# Patient Record
Sex: Male | Born: 1965 | Race: White | Hispanic: No | Marital: Single | Smoking: Never smoker
Health system: Southern US, Community
[De-identification: ages and names within clinical notes are randomized; demographics above are authoritative.]

## PROBLEM LIST (undated history)

## (undated) HISTORY — PX: FRACTURE SURGERY: SHX138

---

## 2006-11-01 ENCOUNTER — Emergency Department (HOSPITAL_COMMUNITY): Admission: EM | Admit: 2006-11-01 | Discharge: 2006-11-01 | Payer: Self-pay | Admitting: Emergency Medicine

## 2015-01-05 ENCOUNTER — Emergency Department
Admission: EM | Admit: 2015-01-05 | Discharge: 2015-01-05 | Disposition: A | Discharge status: Home / Self Care | Payer: 59 | Source: Home / Self Care | Attending: Family Medicine | Admitting: Family Medicine

## 2015-01-05 DIAGNOSIS — T63451A Toxic effect of venom of hornets, accidental (unintentional), initial encounter: Secondary | ICD-10-CM

## 2015-01-05 MED ORDER — DOXYCYCLINE HYCLATE 100 MG PO CAPS: 100.0000 mg | ORAL_CAPSULE | Freq: Two times a day (BID) | ORAL | Status: DC

## 2015-01-05 MED ORDER — TRIAMCINOLONE ACETONIDE 40 MG/ML IJ SUSP
40.0000 mg | Freq: Once | INTRAMUSCULAR | Status: AC
  Administered 2015-01-05: 40 mg via INTRAMUSCULAR

## 2015-01-05 NOTE — Discharge Instructions (Signed)
Begin taking an antihistamine (such as Zyrtec, Claritin, or Allegra) once daily.  Apply cold compress several times daily until swelling resolves. If symptoms become significantly worse during the night or over the weekend, proceed to the local emergency room.    Bee, Wasp, or Hornet Sting Your caregiver has diagnosed you as having an insect sting. An insect sting appears as a red lump in the skin that sometimes has a tiny hole in the center, or it may have a stinger in the center of the wound. The most common stings are from wasps, hornets and bees. Individuals have different reactions to insect stings.  A normal reaction may cause pain, swelling, and redness around the sting site.  A localized allergic reaction may cause swelling and redness that extends beyond the sting site.  A large local reaction may continue to develop over the next 12 to 36 hours.  On occasion, the reactions can be severe (anaphylactic reaction). An anaphylactic reaction may cause wheezing; difficulty breathing; chest pain; fainting; raised, itchy, red patches on the skin; a sick feeling to your stomach (nausea); vomiting; cramping; or diarrhea. If you have had an anaphylactic reaction to an insect sting in the past, you are more likely to have one again. HOME CARE INSTRUCTIONS   With bee stings, a small sac of poison is left in the wound. Brushing across this with something such as a credit card, or anything similar, will help remove this and decrease the amount of the reaction. This same procedure will not help a wasp sting as they do not leave behind a stinger and poison sac.  Apply a cold compress for 10 to 20 minutes every hour for 1 to 2 days, depending on severity, to reduce swelling and itching.  To lessen pain, a paste made of water and baking soda may be rubbed on the bite or sting and left on for 5 minutes.  To relieve itching and swelling, you may use take medication or apply medicated creams or lotions as  directed.  Only take over-the-counter or prescription medicines for pain, discomfort, or fever as directed by your caregiver.  Wash the sting site daily with soap and water. Apply antibiotic ointment on the sting site as directed.  If you suffered a severe reaction:  If you did not require hospitalization, an adult will need to stay with you for 24 hours in case the symptoms return.  You may need to wear a medical bracelet or necklace stating the allergy.  You and your family need to learn when and how to use an anaphylaxis kit or epinephrine injection.  If you have had a severe reaction before, always carry your anaphylaxis kit with you. SEEK MEDICAL CARE IF:   None of the above helps within 2 to 3 days.  The area becomes red, warm, tender, and swollen beyond the area of the bite or sting.  You have an oral temperature above 102 F (38.9 C). SEEK IMMEDIATE MEDICAL CARE IF:  You have symptoms of an allergic reaction which are:  Wheezing.  Difficulty breathing.  Chest pain.  Lightheadedness or fainting.  Itchy, raised, red patches on the skin.  Nausea, vomiting, cramping or diarrhea. ANY OF THESE SYMPTOMS MAY REPRESENT A SERIOUS PROBLEM THAT IS AN EMERGENCY. Do not wait to see if the symptoms will go away. Get medical help right away. Call your local emergency services (911 in [place] DO NOT drive yourself to the hospital. MAKE SURE YOU:   Understand these instructions.  instructions.  Will watch your condition.  Will get help right away if you are not doing well or get worse. Document Released: 05/09/2005 Document Revised: 08/01/2011 Document Reviewed: 10/24/2009 [facility] Patient Information 2015 [place], [place]. This information is not intended to replace advice given to you by your health care provider. Make sure you discuss any questions you have with your health care provider.

## 2015-01-05 NOTE — ED Provider Notes (Signed)
CSN: [id number]     Arrival date & time 01/05/15  1808 History   First MD Initiated Contact with Patient 01/05/15 1842     Chief Complaint  Patient presents with  . Insect Bite      HPI Comments: Patient was trimming a hedge yesterday evening at about 7:30 when numerous hornets stung him on his right hand/wrist, left elbow, right abdomen, and right jaw.  No difficulty swallowing, shortness of breath, or wheezing.  No hives.  Today his right hand/wrist and distal forearm were swollen, erythematous, and warm.  He feels well otherwise.  No fevers, chills, and sweats                                                                                                                                                   The history is provided by the patient.    History reviewed. No pertinent past medical history. Past Surgical History  Procedure Laterality Date  . Fracture surgery      rt pinky finger age 13   History reviewed. No pertinent family history. Social History  Substance Use Topics  . Smoking status: Never Smoker   . Smokeless tobacco: None  . Alcohol Use: Yes     Comment: 1-2 beer    Review of Systems  Constitutional: Negative for fever, chills, diaphoresis, activity change and fatigue.  HENT: Negative.  Negative for sore throat and trouble swallowing.   Eyes: Negative.   Respiratory: Negative for cough, chest tightness, shortness of breath, wheezing and stridor.   Cardiovascular: Negative.   Gastrointestinal: Negative.   Genitourinary: Negative.   Musculoskeletal: Negative.   Skin: Positive for color change.  Neurological: Negative for headaches.    Allergies  Review of patient's allergies indicates not on file.  Home Medications   Prior to Admission medications   Medication Sig Start Date End Date Taking? Authorizing Provider  doxycycline (VIBRAMYCIN) 100 MG capsule Take 1 capsule (100 mg total) by mouth 2 (two) times daily. 01/05/15   Lattie Haw, MD   BP 115/75  mmHg  Pulse 80  Temp(Src) 99.4 F (37.4 C) (Oral)  Ht 6\' 5"  (1.956 m)  Wt 230 lb (104.327 kg)  BMI 27.27 kg/m2  SpO2 96% Physical Exam  Constitutional: He is oriented to person, place, and time. He appears well-developed and well-nourished. No distress.  HENT:  Head: Normocephalic.  Nose: Nose normal.  Mouth/Throat: Oropharynx is clear and moist.  Eyes: Conjunctivae are normal. Pupils are equal, round, and reactive to light. Right eye exhibits no discharge. Left eye exhibits no discharge.  Neck: Neck supple.  Cardiovascular: Normal heart sounds.   Pulmonary/Chest: Breath sounds normal.  Abdominal: There is no tenderness.  Musculoskeletal: He exhibits no edema.  Lymphadenopathy:    He has no cervical adenopathy.  Neurological: He is alert and oriented to person,  place, and time.  Skin: Skin is warm and dry. He is not diaphoretic. There is erythema.     The dorsum of patient's right wrist and hand are edematous, warm, and mildly erythematous without tenderness to palpation.  There is also a patch of macular erythema on his right lower abdomen and erythema on his chin.  Nursing note and vitals reviewed.   ED Course  Procedures  none   MDM   1. Hornet sting, accidental or unintentional, initial encounter; ?early cellulitis    Kenalog  IM Begin doxycycline  BID for staph coverage Begin taking an antihistamine (such as Zyrtec, Claritin, or Allegra) once daily.  Apply cold compress several times daily until swelling resolves. If symptoms become significantly worse during the night or over the weekend, proceed to the local emergency room.     Lattie Haw, MD 01/06/15 [phone]

## 2015-01-05 NOTE — ED Notes (Signed)
About 7 pm 8/14 pt was working in the yard and stirred up bees nest.  Has sting on left elbow, multiple stings on right hand, multiple stings on right jaw and several on side Jaw and right hand swollen

## 2020-01-02 ENCOUNTER — Other Ambulatory Visit: Payer: Self-pay

## 2020-01-02 ENCOUNTER — Emergency Department (INDEPENDENT_AMBULATORY_CARE_PROVIDER_SITE_OTHER)
Admission: EM | Admit: 2020-01-02 | Discharge: 2020-01-02 | Disposition: A | Discharge status: Home / Self Care | Payer: 59 | Source: Home / Self Care

## 2020-01-02 ENCOUNTER — Encounter: Payer: Self-pay | Admitting: Emergency Medicine

## 2020-01-02 ENCOUNTER — Emergency Department (INDEPENDENT_AMBULATORY_CARE_PROVIDER_SITE_OTHER): Payer: 59

## 2020-01-02 DIAGNOSIS — J029 Acute pharyngitis, unspecified: Secondary | ICD-10-CM

## 2020-01-02 DIAGNOSIS — R05 Cough: Secondary | ICD-10-CM

## 2020-01-02 DIAGNOSIS — R059 Cough, unspecified: Secondary | ICD-10-CM

## 2020-01-02 DIAGNOSIS — R062 Wheezing: Secondary | ICD-10-CM

## 2020-01-02 DIAGNOSIS — J9801 Acute bronchospasm: Secondary | ICD-10-CM

## 2020-01-02 MED ORDER — ALBUTEROL SULFATE HFA 108 (90 BASE) MCG/ACT IN AERS: 1.0000 | INHALATION_SPRAY | Freq: Four times a day (QID) | RESPIRATORY_TRACT | 0 refills | Status: AC | PRN

## 2020-01-02 MED ORDER — PREDNISONE 20 MG PO TABS: ORAL_TABLET | ORAL | 0 refills | Status: AC

## 2020-01-02 NOTE — Discharge Instructions (Signed)
°  You may try the prednisone and albuterol to help with cough and wheeze.  Call to schedule a follow up visit with a primary care provider for ongoing healthcare needs and recheck of symptoms next week if not improving.  Due to concern for possibly having Covid-19, it is advised that you self-isolate at home until test results come back, usually 2-3 days.  If positive, it is recommended you stay isolated for at least 10 days after symptom onset and 24 hours after last fever without taking medication (whichever is longer).  If you MUST go out, please wear a mask at all times, limit contact with others.   If your test is negative, you still have plenty of time to get the Covid vaccine. It is recommended you schedule an appointment to get your vaccine once you get over this current illness.  Please ask your primary care provider about any questions/concerns related to the vaccine.

## 2020-01-02 NOTE — ED Triage Notes (Signed)
Cough, runny nose, sore throat x 4 days, runny nose ans cough have resolved still has scratchy throat. Unvaccinated

## 2020-01-02 NOTE — ED Provider Notes (Signed)
[facility]    CSN: [id number] Arrival date & time: 01/02/20  1807      History   Chief Complaint Chief Complaint  Patient presents with  . Cough    HPI Todd Dorsey is a 54 y.o. male.   HPI  Todd Dorsey is a 54 y.o. male presenting to UC with c/o 4 days of cough, runny nose, and sore throat.  Runny nose has nearly resolved but he still has a scratchy throat and dry cough.  Denies known exposure to strep or Covid but he is unvaccinated, and a nurse at his work recommended he be tested for [employer]. No known fever. Denies n/v/d. No recent travel. No hx of asthma. Pt does not smoke cigarettes but has smoked marijuana in the past.    History reviewed. No pertinent past medical history.  There are no problems to display for this patient.   Past Surgical History:  Procedure Laterality Date  . FRACTURE SURGERY     rt pinky finger age 52       Home Medications    Prior to Admission medications   Medication Sig Start Date End Date Taking? Authorizing Provider  albuterol (VENTOLIN HFA) 108 (90 Base) MCG/ACT inhaler Inhale 1-2 puffs into the lungs every 6 (six) hours as needed for wheezing or shortness of breath. 01/02/20   Lurene Shadow, PA-C  predniSONE (DELTASONE) 20 MG tablet 3 tabs po day one, then 2 po daily x 4 days 01/02/20   Lurene Shadow, PA-C    Family History Family History  Problem Relation Age of Onset  . Thyroid disease Mother     Social History Social History   Tobacco Use  . Smoking status: Never Smoker  . Smokeless tobacco: Never Used  Vaping Use  . Vaping Use: Never used  Substance Use Topics  . Alcohol use: Yes    Comment: 1-2 beer  . Drug use: Not on file     Allergies   Patient has no known allergies.   Review of Systems Review of Systems  Constitutional: Negative for chills and fever.  HENT: Positive for congestion (minimal) and sore throat. Negative for ear pain, trouble swallowing and voice change.   Respiratory:  Positive for cough and wheezing (occasional). Negative for chest tightness and shortness of breath.   Cardiovascular: Negative for chest pain and palpitations.  Gastrointestinal: Negative for abdominal pain, diarrhea, nausea and vomiting.  Musculoskeletal: Negative for arthralgias, back pain and myalgias.  Skin: Negative for rash.  Neurological: Negative for dizziness, light-headedness and headaches.  All other systems reviewed and are negative.    Physical Exam Triage Vital Signs ED Triage Vitals  Enc Vitals Group     BP 01/02/20 1843 (!) 144/93     Pulse Rate 01/02/20 1843 84     Resp --      Temp 01/02/20 1843 99.2 F (37.3 C)     Temp Source 01/02/20 1843 Oral     SpO2 01/02/20 1843 96 %     Weight 01/02/20 1844 275 lb (124.7 kg)     Height 01/02/20 1844 6\' 5"  (1.956 m)     Head Circumference --      Peak Flow --      Pain Score 01/02/20 1843 0     Pain Loc --      Pain Edu? --      Excl. in GC? --    No data found.  Updated Vital Signs BP 03/03/20)  144/93 (BP Location: Right Arm)   Pulse 84   Temp 99.2 F (37.3 C) (Oral)   Ht 6\' 5"  (1.956 m)   Wt 275 lb (124.7 kg)   SpO2 96%   BMI 32.61 kg/m   Visual Acuity Right Eye Distance:   Left Eye Distance:   Bilateral Distance:    Right Eye Near:   Left Eye Near:    Bilateral Near:     Physical Exam Vitals and nursing note reviewed.  Constitutional:      General: He is not in acute distress.    Appearance: Normal appearance. He is well-developed. He is not ill-appearing, toxic-appearing or diaphoretic.  HENT:     Head: Normocephalic and atraumatic.     Right Ear: Tympanic membrane and ear canal normal.     Left Ear: Tympanic membrane and ear canal normal.     Nose: Nose normal.     Right Sinus: No maxillary sinus tenderness or frontal sinus tenderness.     Left Sinus: No maxillary sinus tenderness or frontal sinus tenderness.     Mouth/Throat:     Lips: Pink.     Mouth: Mucous membranes are moist.     Pharynx:  Oropharynx is clear. Uvula midline. Posterior oropharyngeal erythema present. No pharyngeal swelling, oropharyngeal exudate or uvula swelling.  Cardiovascular:     Rate and Rhythm: Normal rate and regular rhythm.  Pulmonary:     Effort: Pulmonary effort is normal. No respiratory distress.     Breath sounds: No stridor. Wheezing (diffuse) present. No rhonchi or rales.  Musculoskeletal:        General: Normal range of motion.     Cervical back: Normal range of motion.  Skin:    General: Skin is warm and dry.  Neurological:     Mental Status: He is alert and oriented to person, place, and time.  Psychiatric:        Behavior: Behavior normal.      UC Treatments / Results  Labs (all labs ordered are listed, but only abnormal results are displayed) Labs Reviewed  NOVEL CORONAVIRUS, NAA    EKG   Radiology DG Chest 2 View  Result Date: 01/02/2020 CLINICAL DATA:  Cough, congestion, wheezing EXAM: CHEST - 2 VIEW COMPARISON:  None FINDINGS: Heart is normal size. Calcified granuloma in the right mid lung. Lungs otherwise clear. No effusions. No acute bony abnormality. Severe rightward scoliosis in the thoracic spine. IMPRESSION: Old granulomatous disease. No active disease. Electronically Signed   By: 03/03/2020 M.D.   On: 01/02/2020 19:35    Procedures Procedures (including critical care time)  Medications Ordered in UC Medications - No data to display  Initial Impression / Assessment and Plan / UC Course  I have reviewed the triage vital signs and the nursing notes.  Pertinent labs & imaging results that were available during my care of the patient were reviewed by me and considered in my medical decision making (see chart for details).    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  Covie-19 pending Pt would like to try albuterol inhaler and prednisone for wheeze and  cough Encouraged f/u with a PCP Discussed symptoms that warrant emergent care in the ED. AVS given  Final Clinical Impressions(s) / UC Diagnoses   Final diagnoses:  Cough  Sore throat  Wheeze  Acute bronchospasm     Discharge Instructions  You may try the prednisone and albuterol to help with cough and wheeze.  Call to schedule a follow up visit with a primary care provider for ongoing healthcare needs and recheck of symptoms next week if not improving.  Due to concern for possibly having Covid-19, it is advised that you self-isolate at home until test results come back, usually 2-3 days.  If positive, it is recommended you stay isolated for at least 10 days after symptom onset and 24 hours after last fever without taking medication (whichever is longer).  If you MUST go out, please wear a mask at all times, limit contact with others.   If your test is negative, you still have plenty of time to get the Covid vaccine. It is recommended you schedule an appointment to get your vaccine once you get over this current illness.  Please ask your primary care provider about any questions/concerns related to the vaccine.      ED Prescriptions    Medication Sig Dispense Auth. Provider   predniSONE (DELTASONE) 20 MG tablet 3 tabs po day one, then 2 po daily x 4 days 11 tablet Desi Carby O, PA-C   albuterol (VENTOLIN HFA) 108 (90 Base) MCG/ACT inhaler Inhale 1-2 puffs into the lungs every 6 (six) hours as needed for wheezing or shortness of breath. 18 g Lurene Shadow, PA-C     PDMP not reviewed this encounter.   Lurene Shadow, [place] 01/03/20 [phone]

## 2020-01-04 LAB — NOVEL CORONAVIRUS, NAA: SARS-CoV-2, NAA: NOT DETECTED

## 2020-01-04 LAB — SARS-COV-2, NAA 2 DAY TAT

## 2021-04-05 IMAGING — DX DG CHEST 2V
2 series · 2 of 2 positions shown · non-contrast
Comparison: None

CLINICAL DATA: Cough, congestion, wheezing

EXAM:
CHEST - 2 VIEW

[chest pa]
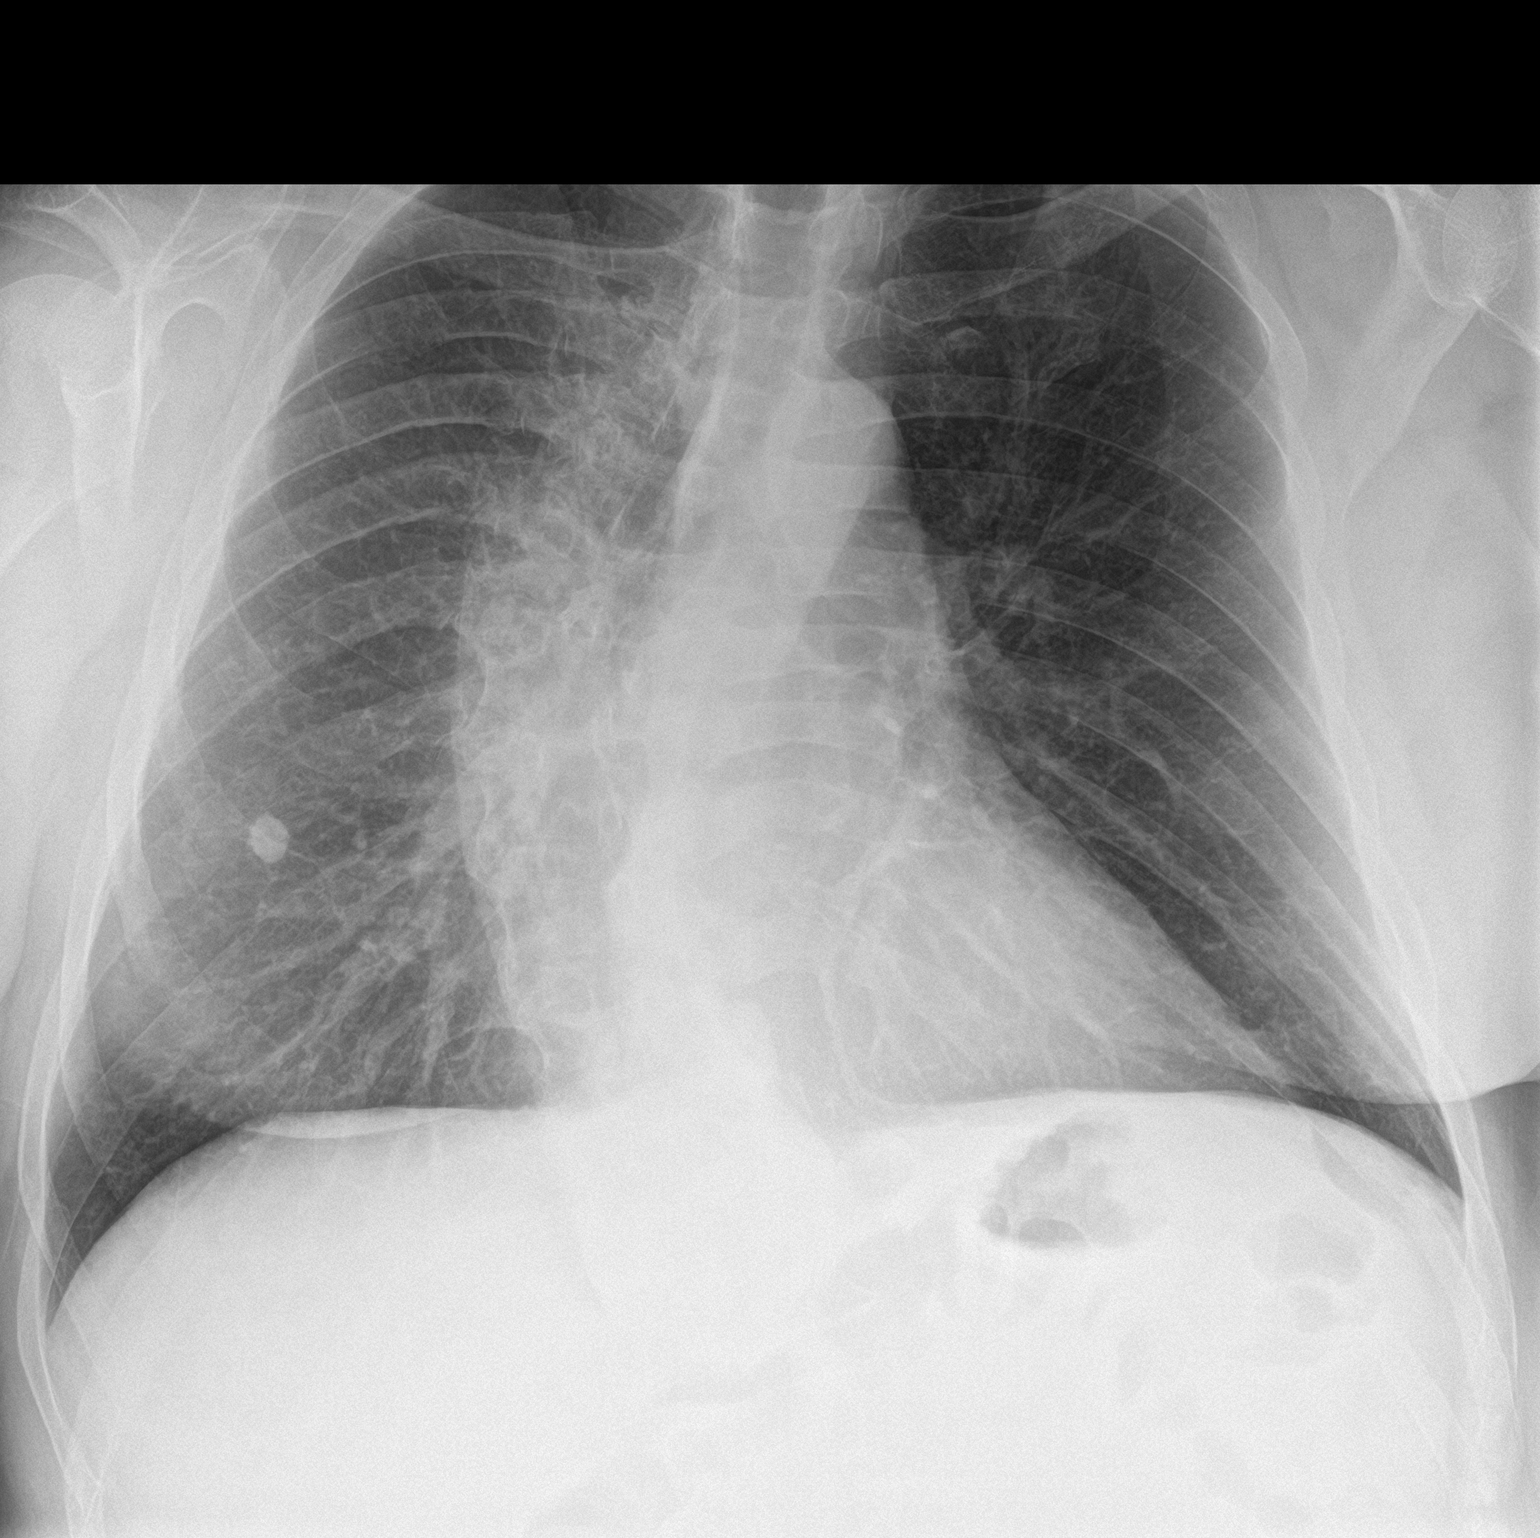

[chest lat]
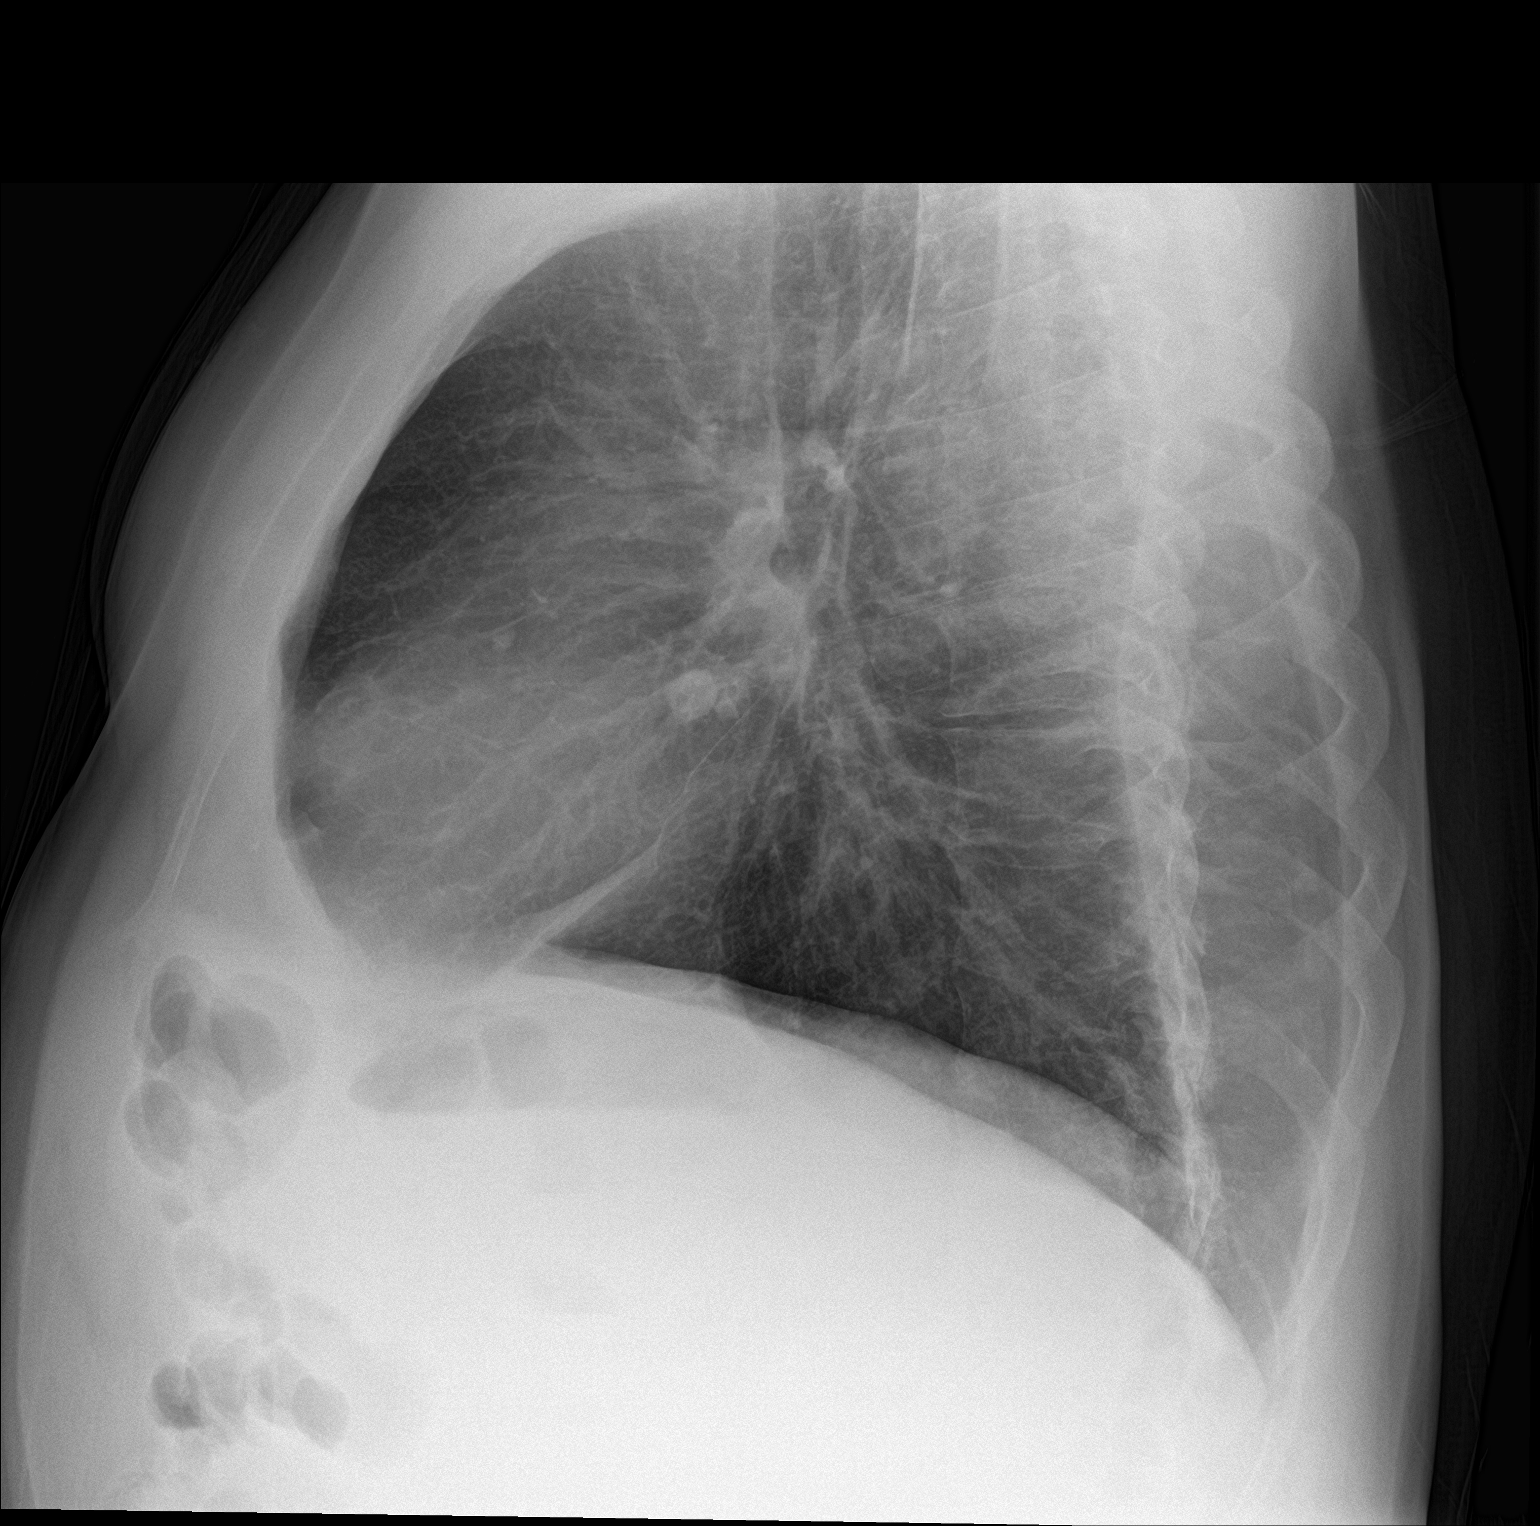

[2 of 2 positions shown; findings below may reference images not displayed]

FINDINGS: Heart is normal size. Calcified granuloma in the right mid lung.
Lungs otherwise clear. No effusions. No acute bony abnormality.
Severe rightward scoliosis in the thoracic spine.
IMPRESSION: Old granulomatous disease.

No active disease.

## 2024-05-14 ENCOUNTER — Ambulatory Visit
Admission: EM | Admit: 2024-05-14 | Discharge: 2024-05-14 | Disposition: A | Discharge status: Home / Self Care | Payer: Self-pay | Attending: Internal Medicine | Admitting: Internal Medicine

## 2024-05-14 ENCOUNTER — Ambulatory Visit: Payer: Self-pay | Admitting: Internal Medicine

## 2024-05-14 ENCOUNTER — Other Ambulatory Visit (HOSPITAL_BASED_OUTPATIENT_CLINIC_OR_DEPARTMENT_OTHER): Payer: Self-pay

## 2024-05-14 ENCOUNTER — Ambulatory Visit

## 2024-05-14 DIAGNOSIS — M25561 Pain in right knee: Secondary | ICD-10-CM | POA: Diagnosis not present

## 2024-05-14 DIAGNOSIS — R2241 Localized swelling, mass and lump, right lower limb: Secondary | ICD-10-CM

## 2024-05-14 DIAGNOSIS — L03115 Cellulitis of right lower limb: Secondary | ICD-10-CM

## 2024-05-14 MED ORDER — SULFAMETHOXAZOLE-TRIMETHOPRIM 800-160 MG PO TABS: 1.0000 | ORAL_TABLET | Freq: Two times a day (BID) | ORAL | 0 refills | Status: AC

## 2024-05-14 NOTE — Discharge Instructions (Signed)
 X-ray of you knee is pending. We will call if it is abnormal. You have an ultrasound ordered to rule out DVT that has been ordered at [facility] for tomorrow at 2 pm. Address is attached. I have prescribed bactrim  for concern of cellulitis (infection of your skin). I suspect that you have some vein problems so a referral to vascular specialists has been placed and they should call you to schedule the appointment. If they do not call you, please call them yourself at provided phone number. SCHEDULE AN APPOINTMENT WITH A PRIMARY CARE DOCTOR!

## 2024-05-14 NOTE — ED Provider Notes (Signed)
 [facility]    CSN: [id number] Arrival date & time: 05/14/24  1659      History   Chief Complaint Chief Complaint  Patient presents with   Leg Pain    HPI MARCELIS WISSNER is a 58 y.o. male.   Patient presents with right leg pain that starts at anterior right knee and radiates down shin.  He also reports some pain in posterior calf that he describes as muscle spasms.  He reports that this has been an intermittent issue for years but he has never had it evaluated.  This current flareup started about 3 days ago.  Denies any recent injury.  He has been wearing a knee brace.  He has not taken any medication for pain.  Reports that he did drink some whiskey to see if this will help with pain.  Denies numbness or tingling.  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   Leg Pain   History reviewed. No pertinent past medical history.  There are no active problems to display for this patient.   Past Surgical History:  Procedure Laterality Date   FRACTURE SURGERY     rt pinky finger age 3       Home Medications    Prior to Admission medications  Medication Sig Start Date End Date Taking? Authorizing Provider  sulfamethoxazole -trimethoprim  (BACTRIM  DS) 800-160 MG tablet Take 1 tablet by mouth 2 (two) times daily for 7 days. 05/14/24 05/21/24 Yes Glenda Kunst, Darryle BRAVO, FNP  albuterol  (VENTOLIN  HFA) 108 (90 Base) MCG/ACT inhaler Inhale 1-2 puffs into the lungs every 6 (six) hours as needed for wheezing or shortness of breath. Patient not taking: Reported on 05/14/2024 01/02/20   Anitra Rocky KIDD, PA-C  predniSONE  (DELTASONE ) 20 MG tablet 3 tabs po day one, then 2 po daily x 4 days Patient not taking: Reported on 05/14/2024 01/02/20   Anitra Rocky KIDD, PA-C    Family History Family History  Problem Relation Age of Onset   Thyroid disease Mother     Social History Social History[1]   Allergies   Patient has no known allergies.   Review  of Systems Review of Systems Per HPI  Physical Exam Triage Vital Signs ED Triage Vitals  Encounter Vitals Group     BP 05/14/24 1718 111/74     Girls Systolic BP Percentile --      Girls Diastolic BP Percentile --      Boys Systolic BP Percentile --      Boys Diastolic BP Percentile --      Pulse Rate 05/14/24 1718 92     Resp 05/14/24 1718 18     Temp 05/14/24 1718 98.4 F (36.9 C)     Temp Source 05/14/24 1718 Oral     SpO2 05/14/24 1718 95 %     Weight --      Height --      Head Circumference --      Peak Flow --      Pain Score 05/14/24 1716 6     Pain Loc --      Pain Education --      Exclude from Growth Chart --    No data found.  Updated Vital Signs BP 111/74 (BP Location: Right Arm)   Pulse 92   Temp 98.4 F (36.9 C) (Oral)   Resp 18   SpO2 95%   Visual Acuity Right Eye Distance:   Left  Eye Distance:   Bilateral Distance:    Right Eye Near:   Left Eye Near:    Bilateral Near:     Physical Exam Constitutional:      General: He is not in acute distress.    Appearance: Normal appearance. He is not toxic-appearing or diaphoretic.  HENT:     Head: Normocephalic and atraumatic.  Eyes:     Extraocular Movements: Extraocular movements intact.     Conjunctiva/sclera: Conjunctivae normal.  Pulmonary:     Effort: Pulmonary effort is normal.  Musculoskeletal:       Legs:     Comments: Mild tenderness to palpation to anterior lower knee.  No crepitus noted.  Swelling noted throughout lower leg with redness to right lower leg.  Nonpitting edema noted.  No tenderness to foot.  Capillary refill and pulses are intact.  Patient is able to ambulate without difficulty.  Full range of motion present.  Veins overlying anterior knee are prominent.  Neurological:     General: No focal deficit present.     Mental Status: He is alert and oriented to person, place, and time. Mental status is at baseline.  Psychiatric:        Mood and Affect: Mood normal.         Behavior: Behavior normal.        Thought Content: Thought content normal.        Judgment: Judgment normal.      UC Treatments / Results  Labs (all labs ordered are listed, but only abnormal results are displayed) Labs Reviewed  CBC  BASIC METABOLIC PANEL WITH GFR    EKG   Radiology DG Knee Complete 4 Views Right Result Date: 05/14/2024 CLINICAL DATA:  Knee pain. EXAM: RIGHT KNEE - COMPLETE 4+ VIEW COMPARISON:  None Available. FINDINGS: No evidence of fracture, dislocation, or joint effusion. Minimal medial tibiofemoral joint space narrowing. No osteophytes, erosions, or focal bone abnormality. Soft tissues are unremarkable. IMPRESSION: Minimal medial tibiofemoral joint space narrowing. Electronically Signed   By: Andrea Gasman M.D.   On: 05/14/2024 18:38    Procedures Procedures (including critical care time)  Medications Ordered in UC Medications - No data to display  Initial Impression / Assessment and Plan / UC Course  I have reviewed the triage vital signs and the nursing notes.  Pertinent labs & imaging results that were available during my care of the patient were reviewed by me and considered in my medical decision making (see chart for details).     I suspect patient's right lower leg pain and swelling could be multifactoral.  Differential diagnoses include osteoarthritis of right knee, cellulitis of right lower extremity, DVT, venous insufficiency.  Recommended ruling out DVT, although I do have a low suspicion for DVT given Wells score is 3.  DVT ultrasound was ordered at [facility] for tomorrow at 2 PM given there was no availability today.  Right knee x-ray showing Minimal medial tibiofemoral joint space narrowing. This could be contributing to pain.  Patient has notable redness and swelling to right lower extremity which could simply due to venous insufficiency but given redness noted and increase in pain over the past few days, will opt to treat with  Bactrim  to cover for cellulitis.  BMP and CBC pending.  Awaiting results.  Ambulatory referral to vascular specialty was placed.  Advised patient that if they do not call him, he is to call them himself at provided phone number.  Recommended elevating extremities.  Advised patient to make appointment with PCP as well.  Advised strict return precautions.  Patient verbalized understanding and was agreeable with plan. Final Clinical Impressions(s) / UC Diagnoses   Final diagnoses:  Acute pain of right knee  Localized swelling of right lower leg  Cellulitis of right lower leg     Discharge Instructions      X-ray of you knee is pending. We will call if it is abnormal. You have an ultrasound ordered to rule out DVT that has been ordered at [facility] for tomorrow at 2 pm. Address is attached. I have prescribed bactrim  for concern of cellulitis (infection of your skin). I suspect that you have some vein problems so a referral to vascular specialists has been placed and they should call you to schedule the appointment. If they do not call you, please call them yourself at provided phone number. SCHEDULE AN APPOINTMENT WITH A PRIMARY CARE DOCTOR!     ED Prescriptions     Medication Sig Dispense Auth. Provider   sulfamethoxazole -trimethoprim  (BACTRIM  DS) 800-160 MG tablet Take 1 tablet by mouth 2 (two) times daily for 7 days. 14 tablet [place], [place]yn [address] [place], [place]      PDMP not reviewed this encounter.     [1]  Social History Tobacco Use   Smoking status: Never   Smokeless tobacco: Never  Vaping Use   Vaping status: Never Used  Substance Use Topics   Alcohol use: Yes    Comment: 1-2 beer   Drug use: Never     Hazen Darryle BRAVO, [place] 05/14/24 1902  "

## 2024-05-14 NOTE — ED Triage Notes (Signed)
 Pt c/o right leg pain,shin muscle and knee pain states its sciatica nerve pain x 3 days. Using a knee brace.

## 2024-05-15 ENCOUNTER — Ambulatory Visit (HOSPITAL_BASED_OUTPATIENT_CLINIC_OR_DEPARTMENT_OTHER)
Admission: RE | Admit: 2024-05-15 | Discharge: 2024-05-15 | Disposition: A | Discharge status: Home / Self Care | Payer: Self-pay | Source: Ambulatory Visit | Attending: Internal Medicine | Admitting: Internal Medicine

## 2024-05-15 DIAGNOSIS — R2241 Localized swelling, mass and lump, right lower limb: Secondary | ICD-10-CM | POA: Diagnosis present

## 2024-05-15 DIAGNOSIS — M25561 Pain in right knee: Secondary | ICD-10-CM | POA: Diagnosis present

## 2024-05-15 DIAGNOSIS — L03115 Cellulitis of right lower limb: Secondary | ICD-10-CM | POA: Insufficient documentation

## 2024-05-15 LAB — BASIC METABOLIC PANEL WITH GFR
BUN/Creatinine Ratio: 16 (ref 9–20)
BUN: 15 mg/dL (ref 6–24)
CO2: 21 mmol/L (ref 20–29)
Calcium: 9.2 mg/dL (ref 8.7–10.2)
Chloride: 104 mmol/L (ref 96–106)
Creatinine, Ser: 0.92 mg/dL (ref 0.76–1.27)
Glucose: 84 mg/dL (ref 70–99)
Potassium: 4.6 mmol/L (ref 3.5–5.2)
Sodium: 139 mmol/L (ref 134–144)
eGFR: 98 mL/min/1.73

## 2024-05-15 LAB — CBC
Hematocrit: 44 % (ref 37.5–51.0)
Hemoglobin: 14.6 g/dL (ref 13.0–17.7)
MCH: 32.5 pg (ref 26.6–33.0)
MCHC: 33.2 g/dL (ref 31.5–35.7)
MCV: 98 fL — ABNORMAL HIGH (ref 79–97)
Platelets: 309 x10E3/uL (ref 150–450)
RBC: 4.49 x10E6/uL (ref 4.14–5.80)
RDW: 13 % (ref 11.6–15.4)
WBC: 9.9 x10E3/uL (ref 3.4–10.8)
